# Patient Record
Sex: Female | Born: 1965 | Race: White | Hispanic: No | Marital: Single | Smoking: Former smoker
Health system: Southern US, Community
[De-identification: ages and names within clinical notes are randomized; demographics above are authoritative.]

## PROBLEM LIST (undated history)

## (undated) DIAGNOSIS — R5382 Chronic fatigue, unspecified: Secondary | ICD-10-CM

## (undated) DIAGNOSIS — F419 Anxiety disorder, unspecified: Secondary | ICD-10-CM

## (undated) DIAGNOSIS — F329 Major depressive disorder, single episode, unspecified: Secondary | ICD-10-CM

## (undated) DIAGNOSIS — J3089 Other allergic rhinitis: Secondary | ICD-10-CM

## (undated) DIAGNOSIS — F32A Depression, unspecified: Secondary | ICD-10-CM

## (undated) HISTORY — PX: BACK SURGERY: SHX140

---

## 2000-05-11 ENCOUNTER — Other Ambulatory Visit: Admission: RE | Admit: 2000-05-11 | Discharge: 2000-05-11 | Payer: Self-pay | Admitting: Obstetrics and Gynecology

## 2001-06-06 ENCOUNTER — Encounter: Payer: Self-pay | Admitting: Obstetrics and Gynecology

## 2001-06-06 ENCOUNTER — Other Ambulatory Visit: Admission: RE | Admit: 2001-06-06 | Discharge: 2001-06-06 | Payer: Self-pay | Admitting: Obstetrics and Gynecology

## 2001-06-06 ENCOUNTER — Ambulatory Visit (HOSPITAL_COMMUNITY): Admission: RE | Admit: 2001-06-06 | Discharge: 2001-06-06 | Payer: Self-pay | Admitting: Obstetrics and Gynecology

## 2002-08-26 ENCOUNTER — Other Ambulatory Visit: Admission: RE | Admit: 2002-08-26 | Discharge: 2002-08-26 | Payer: Self-pay | Admitting: Obstetrics and Gynecology

## 2002-09-04 ENCOUNTER — Encounter: Admission: RE | Admit: 2002-09-04 | Discharge: 2002-09-04 | Payer: Self-pay | Admitting: Obstetrics and Gynecology

## 2002-09-04 ENCOUNTER — Encounter: Payer: Self-pay | Admitting: Obstetrics and Gynecology

## 2003-08-27 ENCOUNTER — Other Ambulatory Visit: Admission: RE | Admit: 2003-08-27 | Discharge: 2003-08-27 | Payer: Self-pay | Admitting: Obstetrics and Gynecology

## 2004-08-20 ENCOUNTER — Ambulatory Visit: Payer: Self-pay | Admitting: Family Medicine

## 2004-08-26 ENCOUNTER — Ambulatory Visit: Payer: Self-pay | Admitting: Family Medicine

## 2004-09-15 ENCOUNTER — Other Ambulatory Visit: Admission: RE | Admit: 2004-09-15 | Discharge: 2004-09-15 | Payer: Self-pay | Admitting: Obstetrics and Gynecology

## 2004-12-17 ENCOUNTER — Encounter: Admission: RE | Admit: 2004-12-17 | Discharge: 2004-12-17 | Payer: Self-pay | Admitting: *Deleted

## 2005-11-15 ENCOUNTER — Encounter: Admission: RE | Admit: 2005-11-15 | Discharge: 2005-11-15 | Payer: Self-pay | Admitting: Obstetrics and Gynecology

## 2006-01-02 ENCOUNTER — Other Ambulatory Visit: Admission: RE | Admit: 2006-01-02 | Discharge: 2006-01-02 | Payer: Self-pay | Admitting: Obstetrics and Gynecology

## 2008-06-24 ENCOUNTER — Ambulatory Visit: Payer: Self-pay | Admitting: Internal Medicine

## 2008-07-11 ENCOUNTER — Ambulatory Visit: Payer: Self-pay | Admitting: Internal Medicine

## 2010-05-19 ENCOUNTER — Ambulatory Visit: Payer: Self-pay | Admitting: Family Medicine

## 2012-01-07 ENCOUNTER — Emergency Department: Payer: Self-pay | Admitting: Emergency Medicine

## 2012-01-18 ENCOUNTER — Ambulatory Visit: Payer: Self-pay | Admitting: Family Medicine

## 2012-01-25 ENCOUNTER — Ambulatory Visit: Payer: Self-pay | Admitting: Family Medicine

## 2012-03-30 ENCOUNTER — Ambulatory Visit: Payer: Self-pay | Admitting: Orthopedic Surgery

## 2012-07-26 ENCOUNTER — Ambulatory Visit: Payer: Self-pay | Admitting: Family Medicine

## 2014-03-05 IMAGING — CR DG LUMBAR SPINE 2-3V
1 series · 3 of 3 positions shown · non-contrast
Comparison: none

REASON FOR EXAM: low back pain, spasm
COMMENTS:

[Series 1: ap · 0.17mm/px · 3 of 3 slices shown]
[im 1/3]
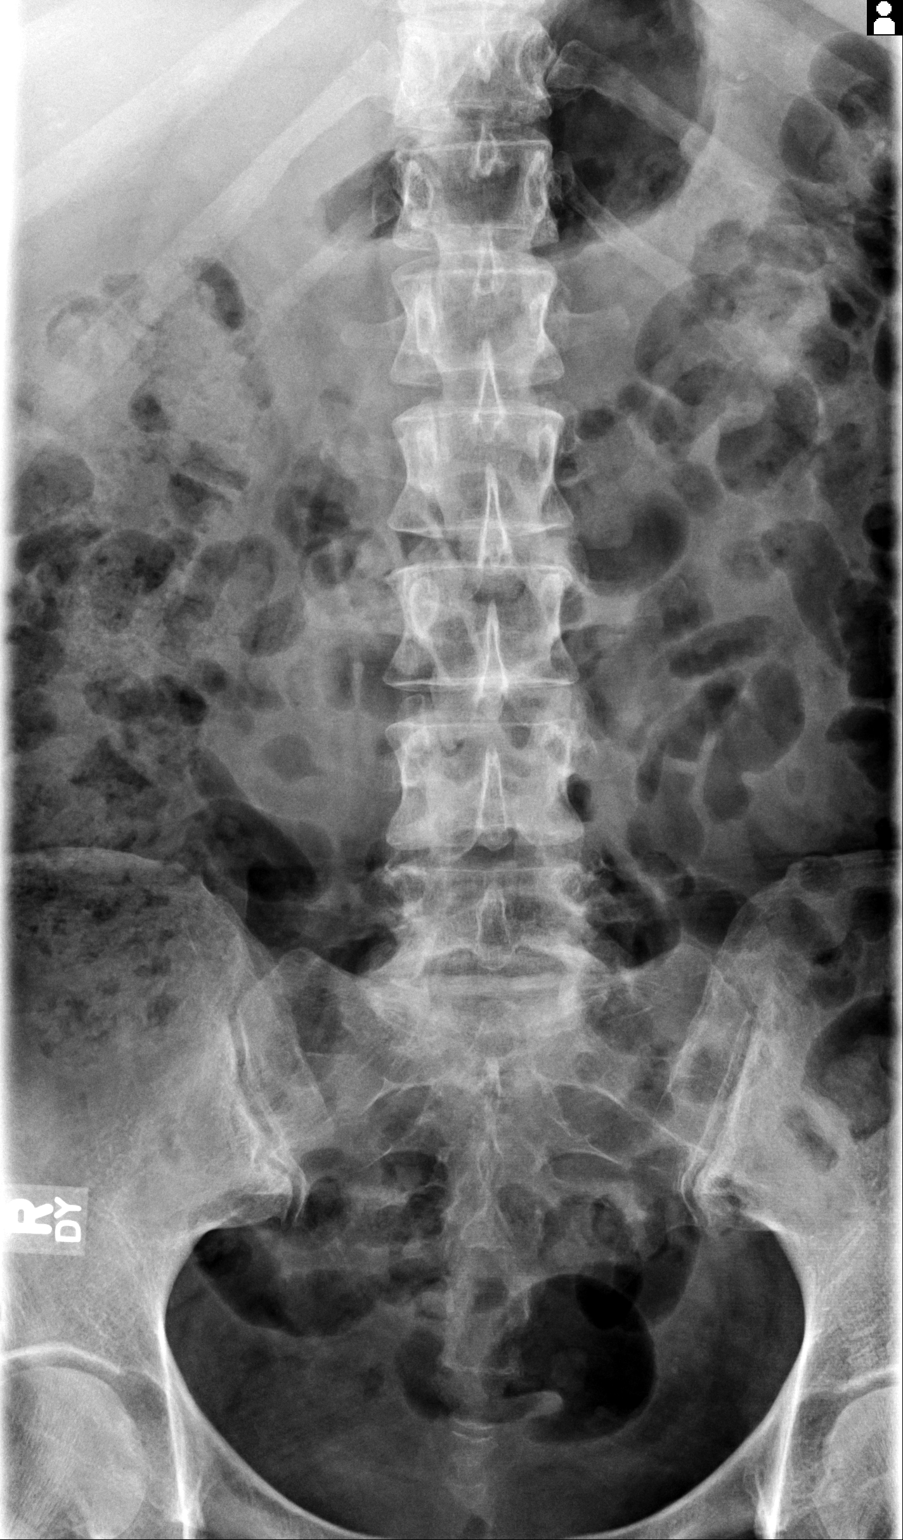
[im 2/3]
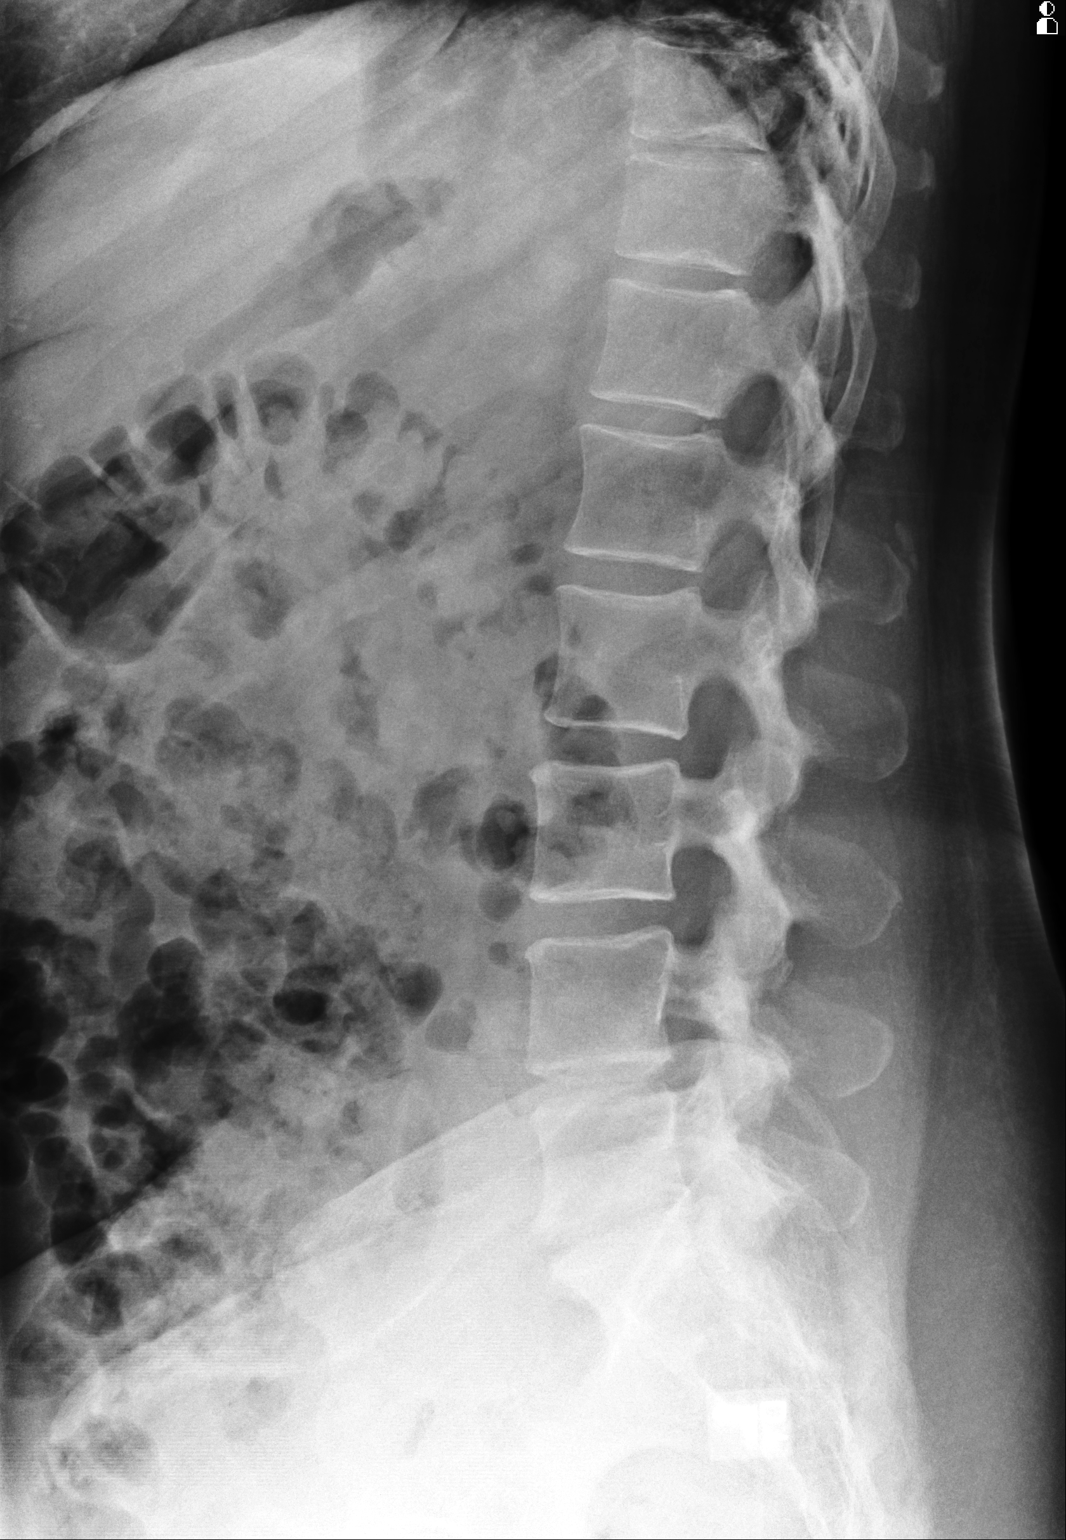
[im 3/3]
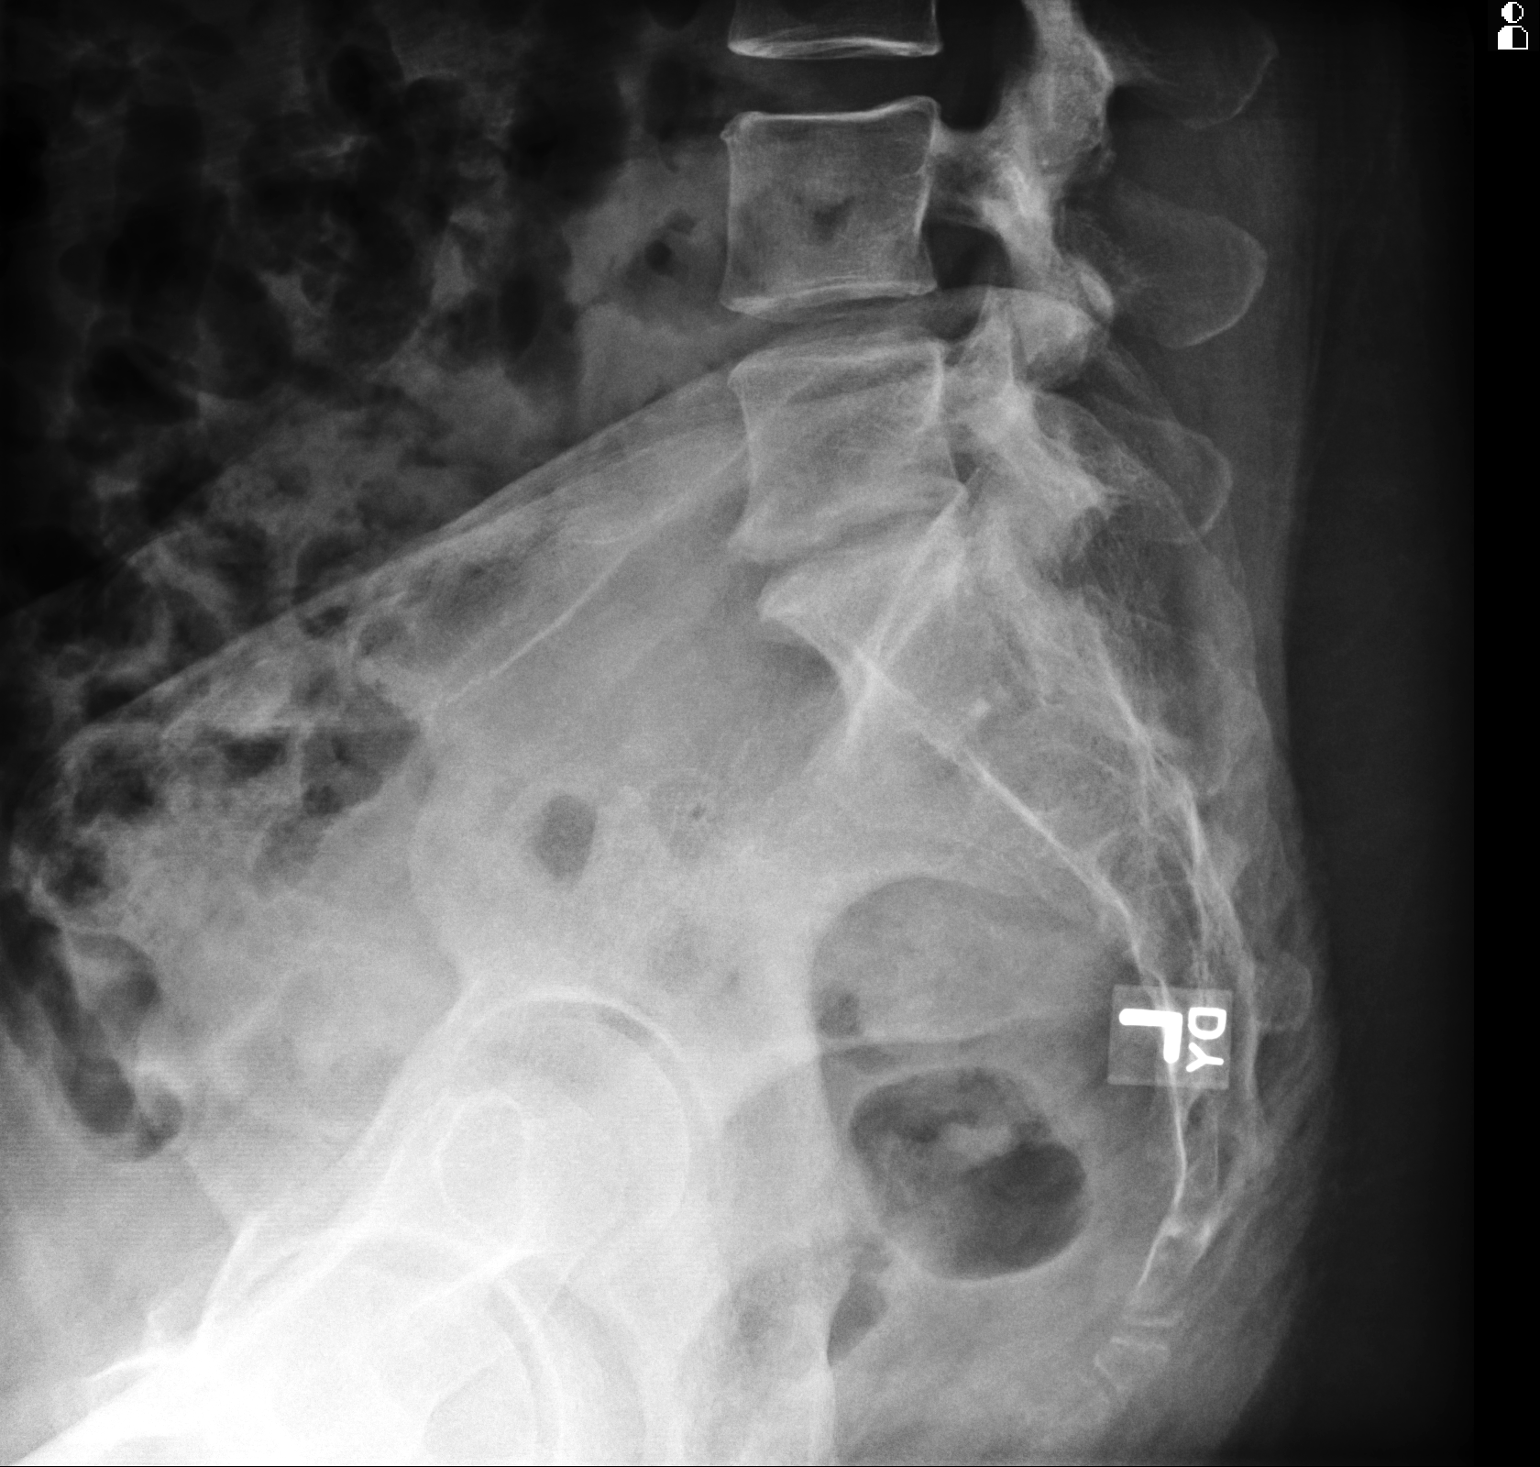

[3 of 3 positions shown; findings below may reference images not displayed]

PROCEDURE:     DXR - DXR LUMBAR SPINE AP AND LATERAL  - January 07, 2012 [DATE]

RESULT:     The lumbar vertebral bodies are preserved in height. The
pedicles and transverse processes are intact. The intravertebral disc space
heights are well-maintained with the exception of L4-L5 where there is mild
narrowing. Mild degenerative change at the L5-S1 disc level is present.
IMPRESSION: There are very mild degenerative changes of the lower
lumbar spine. There is no evidence of a compression fracture nor other acute
abnormality.

[REDACTED]

## 2016-03-19 ENCOUNTER — Ambulatory Visit
Admission: EM | Admit: 2016-03-19 | Discharge: 2016-03-19 | Disposition: A | Discharge status: Home / Self Care | Payer: BC Managed Care – PPO | Attending: Family Medicine | Admitting: Family Medicine

## 2016-03-19 ENCOUNTER — Ambulatory Visit (INDEPENDENT_AMBULATORY_CARE_PROVIDER_SITE_OTHER): Payer: BC Managed Care – PPO

## 2016-03-19 DIAGNOSIS — S93602A Unspecified sprain of left foot, initial encounter: Secondary | ICD-10-CM

## 2016-03-19 DIAGNOSIS — S9032XA Contusion of left foot, initial encounter: Secondary | ICD-10-CM

## 2016-03-19 HISTORY — DX: Chronic fatigue, unspecified: R53.82

## 2016-03-19 HISTORY — DX: Anxiety disorder, unspecified: F41.9

## 2016-03-19 HISTORY — DX: Major depressive disorder, single episode, unspecified: F32.9

## 2016-03-19 HISTORY — DX: Depression, unspecified: F32.A

## 2016-03-19 HISTORY — DX: Other allergic rhinitis: J30.89

## 2016-03-19 MED ORDER — MELOXICAM 15 MG PO TABS: 15.0000 mg | ORAL_TABLET | Freq: Every day | ORAL | 0 refills | Status: AC

## 2016-03-19 NOTE — ED Provider Notes (Signed)
[facility]    CSN: [id number] Arrival date & time: 03/19/16  1348     History   Chief Complaint Chief Complaint  Patient presents with  . Foot Injury    HPI Brandi Wright is a 51 y.o. female.   She is here because of injury to her left foot. She reports she was taxing walking when she tripped over what she thought was the concrete car stopped in a parking lot. She hyperextended her left foot significantly when she fell. She reports the first day foot swelled up like balloon chills and pictures states that it was very tender to touch she thought that it was a safe foot contusion but over the next several days the foot is continued to get worse. She states that swelling has gone down but the pain in the foot is gotten worse. She's had now bruising where she didn't have bruising before she was concerned and worried. She also reports increased swelling of the foot and inability to wear the shoes that she was wearing before. Past history of anxiety chronic fatigue depression and the environmental and seasonal allergies. Only surgeries no motor back surgery in the past. Former smoker and is no pertinent family medical history relevant to today's visit. No known drug allergies.   The history is provided by the patient. No language interpreter was used.  Foot Injury  Location:  Foot Injury: yes   Mechanism of injury: fall   Fall:    Fall occurred:  Tripped and walking   Impact surface:  Primary school teacher of impact:  Feet   Entrapped after fall: no   Foot location:  L foot Pain details:    Quality:  Aching and pressure   Radiates to:  Does not radiate   Severity:  Moderate   Onset quality:  Sudden   Progression:  Worsening Chronicity:  New Dislocation: no   Foreign body present:  No foreign bodies   Past Medical History:  Diagnosis Date  . Anxiety   . Chronic fatigue   . Depression   . Environmental and seasonal allergies     There are no active problems to  display for this patient.   Past Surgical History:  Procedure Laterality Date  . BACK SURGERY      OB History    No data available       Home Medications    Prior to Admission medications   Medication Sig Start Date End Date Taking? Authorizing Provider  cetirizine (ZYRTEC) 10 MG tablet Take 10 mg by mouth daily.   Yes Historical Provider, MD  fluticasone (FLONASE) 50 MCG/ACT nasal spray Place into both nostrils daily.   Yes Historical Provider, MD  sertraline (ZOLOFT) 50 MG tablet Take 50 mg by mouth daily.   Yes Historical Provider, MD  meloxicam (MOBIC) 15 MG tablet Take 1 tablet (15 mg total) by mouth daily. 03/19/16   Hassan Rowan, MD    Family History History reviewed. No pertinent family history.  Social History Social History  Substance Use Topics  . Smoking status: Former Smoker    Types: Cigarettes    Quit date: 03/19/1990  . Smokeless tobacco: Never Used  . Alcohol use Yes     Comment: social     Allergies   Patient has no known allergies.   Review of Systems Review of Systems  Musculoskeletal: Positive for gait problem, joint swelling and myalgias.  All other systems reviewed and are negative.  Physical Exam Triage Vital Signs ED Triage Vitals  Enc Vitals Group     BP 03/19/16 1505 (!) 145/89     Pulse Rate 03/19/16 1505 73     Resp 03/19/16 1505 16     Temp 03/19/16 1505 98.8 F (37.1 C)     Temp Source 03/19/16 1505 Oral     SpO2 03/19/16 1505 100 %     Weight 03/19/16 1506 222 lb (100.7 kg)     Height 03/19/16 1506 5' 6.5" (1.689 m)     Head Circumference --      Peak Flow --      Pain Score 03/19/16 1510 5     Pain Loc --      Pain Edu? --      Excl. in GC? --    No data found.   Updated Vital Signs BP (!) 145/89 (BP Location: Left Arm)   Pulse 73   Temp 98.8 F (37.1 C) (Oral)   Resp 16   Ht 5' 6.5" (1.689 m)   Wt 222 lb (100.7 kg)   LMP 03/14/2016   SpO2 100%   BMI 35.29 kg/m   Visual Acuity Right Eye Distance:     Left Eye Distance:   Bilateral Distance:    Right Eye Near:   Left Eye Near:    Bilateral Near:     Physical Exam  Constitutional: She is oriented to person, place, and time. She appears well-developed and well-nourished.  HENT:  Head: Normocephalic and atraumatic.  Right Ear: External ear normal.  Left Ear: External ear normal.  Eyes: Pupils are equal, round, and reactive to light.  Neck: Normal range of motion.  Pulmonary/Chest: Effort normal.  Musculoskeletal: She exhibits tenderness.       Left foot: There is tenderness, bony tenderness and swelling.       Feet:  Patient was swelling of the entire left foot is some bruising along the medial area of the left foot and marked tenderness over the metatarsal bone at the MP joint pulses intact and is also tenderness over the calcaneus bone as well and the instep of the foot  Neurological: She is alert and oriented to person, place, and time.  Skin: Skin is warm.  Psychiatric: She has a normal mood and affect.  Vitals reviewed.    UC Treatments / Results  Labs (all labs ordered are listed, but only abnormal results are displayed) Labs Reviewed - No data to display  EKG  EKG Interpretation None       Radiology Dg Foot Complete Left  Result Date: 03/19/2016 CLINICAL DATA:  Left foot injury 5 days ago.  Medial pain. EXAM: LEFT FOOT - COMPLETE 3+ VIEW COMPARISON:  None. FINDINGS: There is no evidence of fracture or dislocation. There is mild osteoarthritis of the first MTP joint. There is soft tissue swelling over the medial aspect of the first MTP joint. There is a small plantar calcaneal spur. There is mild osteoarthritis of the talonavicular joint. Soft tissues are unremarkable. IMPRESSION: 1.  No acute osseous injury of the left foot. 2. Mild osteoarthritis of the first MTP joint. Electronically Signed   By: Elige Ko   On: 03/19/2016 15:33    Procedures Procedures (including critical care time)  Medications Ordered  in UC Medications - No data to display   Initial Impression / Assessment and Plan / UC Course  I have reviewed the triage vital signs and the nursing notes.  Pertinent labs & imaging  results that were available during my care of the patient were reviewed by me and considered in my medical decision making (see chart for details).    Recommending since x-ray was negative for fracture that we put patient in the cam boot allow to that those ligaments he'll feel this patient stretched ligaments using regular shoes. Recommend least 2 weeks follow-up PCP or podiatrist of choice after that. Mobic 15 mg 1 tablet day number still recommend icing the foot when able. Offed work note patient declined.   Final Clinical Impressions(s) / UC Diagnoses   Final diagnoses:  Contusion of left foot, initial encounter  Sprain of left foot, initial encounter    New Prescriptions New Prescriptions   MELOXICAM (MOBIC) 15 MG TABLET    Take 1 tablet (15 mg total) by mouth daily.     Hassan RowanEugene Melea Prezioso, MD 03/19/16 [phone]

## 2016-03-19 NOTE — ED Triage Notes (Signed)
Pt reports mechanical fall in parking on Monday injured left foot mostly at base of left great toe. Some bruising to inner foot noted and some mild swelling to foot.
